# Patient Record
Sex: Female | Born: 1995 | Race: White | Hispanic: No | Marital: Single | Smoking: Current every day smoker
Health system: Southern US, Community
[De-identification: ages and names within clinical notes are randomized; demographics above are authoritative.]

---

## 2014-11-09 ENCOUNTER — Encounter (HOSPITAL_COMMUNITY): Payer: Self-pay | Admitting: Emergency Medicine

## 2014-11-09 ENCOUNTER — Emergency Department (HOSPITAL_COMMUNITY)
Admission: EM | Admit: 2014-11-09 | Discharge: 2014-11-09 | Disposition: A | Discharge status: Home / Self Care | Payer: 59 | Source: Home / Self Care | Attending: Emergency Medicine | Admitting: Emergency Medicine

## 2014-11-09 DIAGNOSIS — H66003 Acute suppurative otitis media without spontaneous rupture of ear drum, bilateral: Secondary | ICD-10-CM

## 2014-11-09 DIAGNOSIS — J069 Acute upper respiratory infection, unspecified: Secondary | ICD-10-CM

## 2014-11-09 LAB — POCT RAPID STREP A: Streptococcus, Group A Screen (Direct): NEGATIVE

## 2014-11-09 MED ORDER — GUAIFENESIN-CODEINE 100-10 MG/5ML PO SYRP: 5.0000 mL | ORAL_SOLUTION | Freq: Three times a day (TID) | ORAL | Status: DC | PRN

## 2014-11-09 MED ORDER — AMOXICILLIN 500 MG PO CAPS: 1000.0000 mg | ORAL_CAPSULE | Freq: Three times a day (TID) | ORAL | Status: DC

## 2014-11-09 MED ORDER — IPRATROPIUM BROMIDE 0.06 % NA SOLN: 2.0000 | Freq: Four times a day (QID) | NASAL | Status: DC

## 2014-11-09 NOTE — ED Notes (Signed)
C/o  Sore throat.  Fever.  Chills.  And body aches.  Denies n/v/d.  Symptoms present sine Sunday.

## 2014-11-09 NOTE — ED Provider Notes (Signed)
Chief Complaint   Sore Throat   History of Present Illness   Stacy Marsh is a 18 year old female who has had a three-day history of sore throat, nasal congestion with yellow drainage, cough productive of white to green sputum, headache, chills, myalgias, and temperature to 100. She denies any GI symptoms. No sick exposures.  Review of Systems   Other than as noted above, the patient denies any of the following symptoms: Systemic:  No fevers, chills, sweats, or myalgias. Eye:  No redness or discharge. ENT:  No ear pain, headache, nasal congestion, drainage, sinus pressure, or sore throat. Neck:  No neck pain, stiffness, or swollen glands. Lungs:  No cough, sputum production, hemoptysis, wheezing, chest tightness, shortness of breath or chest pain. GI:  No abdominal pain, nausea, vomiting or diarrhea.  PMFSH   Past medical history, family history, social history, meds, and allergies were reviewed.   Physical exam   Vital signs:  BP 145/82 mmHg  Pulse 110  Temp(Src) 100 F (37.8 C) (Oral)  Resp 16  SpO2 96%  LMP 11/02/2014 General:  Alert and oriented.  In no distress.  Skin warm and dry. Eye:  No conjunctival injection or drainage. Lids were normal. ENT:  Both TMs were slightly erythematous.  Nasal mucosa was clear and uncongested, without drainage.  Mucous membranes were moist.  Pharynx was clear with no exudate or drainage.  There were no oral ulcerations or lesions. Neck:  Supple, no adenopathy, tenderness or mass. Lungs:  No respiratory distress.  Lungs were clear to auscultation, without wheezes, rales or rhonchi.  Breath sounds were clear and equal bilaterally.  Heart:  Regular rhythm, without gallops, murmers or rubs. Skin:  Clear, warm, and dry, without rash or lesions.  Labs   Results for orders placed or performed during the hospital encounter of 11/09/14  POCT rapid strep A [facility])  Result Value Ref Range   Streptococcus, Group A Screen (Direct)  NEGATIVE NEGATIVE    Assessment     The primary encounter diagnosis was Viral URI. A diagnosis of Acute suppurative otitis media of both ears without spontaneous rupture of tympanic membranes, recurrence not specified was also pertinent to this visit.  There is no evidence of pneumonia, strep throat, or sinusitis.    Plan    1.  Meds:  The following meds were prescribed:   Discharge Medication List as of 11/09/2014  6:29 PM    START taking these medications   Details  amoxicillin (AMOXIL) 500 MG capsule Take 2 capsules (1,000 mg total) by mouth 3 (three) times daily., Starting 11/09/2014, Until Discontinued, Normal    guaiFENesin-codeine (ROBITUSSIN AC) 100-10 MG/5ML syrup Take 5 mLs by mouth 3 (three) times daily as needed for cough., Starting 11/09/2014, Until Discontinued, Print    ipratropium (ATROVENT) 0.06 % nasal spray Place 2 sprays into both nostrils 4 (four) times daily., Starting 11/09/2014, Until Discontinued, Normal        2.  Patient Education/Counseling:  The patient was given appropriate handouts, self care instructions, and instructed in symptomatic relief.  Instructed to get extra fluids and extra rest.    3.  Follow up:  The patient was told to follow up here if no better in 3 to 4 days, or sooner if becoming worse in any way, and given some red flag symptoms such as increasing fever, difficulty breathing, chest pain, or persistent vomiting which would prompt immediate return.       Reuben Likesavid C Octavio Matheney, MD 11/09/14 [phone]

## 2014-11-09 NOTE — Discharge Instructions (Signed)

## 2014-11-11 LAB — CULTURE, GROUP A STREP

## 2017-07-12 ENCOUNTER — Encounter (HOSPITAL_BASED_OUTPATIENT_CLINIC_OR_DEPARTMENT_OTHER): Payer: Self-pay

## 2017-07-12 ENCOUNTER — Emergency Department (HOSPITAL_BASED_OUTPATIENT_CLINIC_OR_DEPARTMENT_OTHER)
Admission: EM | Admit: 2017-07-12 | Discharge: 2017-07-12 | Disposition: A | Discharge status: Home / Self Care | Payer: Commercial Managed Care - PPO | Attending: Emergency Medicine | Admitting: Emergency Medicine

## 2017-07-12 ENCOUNTER — Emergency Department (HOSPITAL_BASED_OUTPATIENT_CLINIC_OR_DEPARTMENT_OTHER): Payer: Commercial Managed Care - PPO

## 2017-07-12 DIAGNOSIS — S99911A Unspecified injury of right ankle, initial encounter: Secondary | ICD-10-CM | POA: Diagnosis present

## 2017-07-12 DIAGNOSIS — F17210 Nicotine dependence, cigarettes, uncomplicated: Secondary | ICD-10-CM | POA: Insufficient documentation

## 2017-07-12 DIAGNOSIS — Y929 Unspecified place or not applicable: Secondary | ICD-10-CM | POA: Insufficient documentation

## 2017-07-12 DIAGNOSIS — S93401A Sprain of unspecified ligament of right ankle, initial encounter: Secondary | ICD-10-CM | POA: Insufficient documentation

## 2017-07-12 DIAGNOSIS — Y999 Unspecified external cause status: Secondary | ICD-10-CM | POA: Diagnosis not present

## 2017-07-12 DIAGNOSIS — Y9351 Activity, roller skating (inline) and skateboarding: Secondary | ICD-10-CM | POA: Insufficient documentation

## 2017-07-12 MED ORDER — HYDROCODONE-ACETAMINOPHEN 5-325 MG PO TABS
1.0000 | ORAL_TABLET | Freq: Once | ORAL | Status: AC
  Administered 2017-07-12: 1 via ORAL
  Filled 2017-07-12: qty 1

## 2017-07-12 MED ORDER — IBUPROFEN 800 MG PO TABS: 800.0000 mg | ORAL_TABLET | Freq: Three times a day (TID) | ORAL | 0 refills | Status: AC

## 2017-07-12 NOTE — Discharge Instructions (Signed)
Do not bear weight without crutches until you can walk without pain. Ace wrap for compression.  Intermittently apply ice.  Follow-up with Dr. Pearletha ForgeHudnall if not improving in one week

## 2017-07-12 NOTE — ED Triage Notes (Addendum)
C/o injured right ankle and posterior heel jumping off skateboard last night-NAD-limping gait

## 2017-07-12 NOTE — ED Notes (Signed)
ED Provider at bedside. 

## 2017-07-12 NOTE — ED Provider Notes (Signed)
[facility]   CSN: [id number] Arrival date & time: 07/12/17  1328     History   Chief Complaint Chief Complaint  Patient presents with  . Ankle Injury    HPI Stacy Marsh is a 21 y.o. female. Cc: Ankle sprain  HPI [age] female. Came off of a skateboard last night and inverted her ankle and has pain in the right ankle and walks with a limp  History reviewed. No pertinent past medical history.  There are no active problems to display for this patient.   History reviewed. No pertinent surgical history.  OB History    No data available       Home Medications    Prior to Admission medications   Medication Sig Start Date End Date Taking? Authorizing Provider  ibuprofen (ADVIL,MOTRIN) 800 MG tablet Take 1 tablet (800 mg total) by mouth 3 (three) times daily. 07/12/17   Rolland Porter, MD    Family History No family history on file.  Social History Social History  Substance Use Topics  . Smoking status: Current Every Day Smoker    Packs/day: 1.00    Types: Cigarettes  . Smokeless tobacco: Never Used  . Alcohol use No     Comment: weekly     Allergies   Patient has no known allergies.   Review of Systems Review of Systems  Musculoskeletal:       Pain around the right ankle. Pain with attempted ambulation.     Physical Exam Updated Vital Signs BP (!) 153/86 (BP Location: Left Arm)   Pulse (!) 118   Temp 98.2 F (36.8 C) (Oral)   Resp 16   Ht 5\' 4"  (1.626 m)   Wt 67.1 kg (148 lb)   LMP 06/28/2017   SpO2 100%   BMI 25.40 kg/m   Physical Exam  Constitutional: She is oriented to person, place, and time. She appears well-developed and well-nourished. No distress.  HENT:  Head: Normocephalic.  Eyes: Pupils are equal, round, and reactive to light. Conjunctivae are normal. No scleral icterus.  Neck: Normal range of motion. Neck supple. No thyromegaly present.  Cardiovascular: Normal rate and regular rhythm.  Exam reveals no  gallop and no friction rub.   No murmur heard. Pulmonary/Chest: Effort normal and breath sounds normal. No respiratory distress. She has no wheezes. She has no rales.  Abdominal: Soft. Bowel sounds are normal. She exhibits no distension. There is no tenderness. There is no rebound.  Musculoskeletal: Normal range of motion.  Tenderness primarily medial and posterior around the ankle joint. No pain or tenderness laterally over the lateral malleolus. Nontender over the base or head of the fifth metatarsal.  Neurological: She is alert and oriented to person, place, and time.  Skin: Skin is warm and dry. No rash noted.  Psychiatric: She has a normal mood and affect. Her behavior is normal.     ED Treatments / Results  Labs (all labs ordered are listed, but only abnormal results are displayed) Labs Reviewed - No data to display  EKG  EKG Interpretation None       Radiology Dg Ankle Complete Right  Result Date: 07/12/2017 CLINICAL DATA:  Pain following fall from skateboard EXAM: RIGHT ANKLE - COMPLETE 3+ VIEW COMPARISON:  None. FINDINGS: Frontal, oblique, and lateral views were obtained. There is mild soft tissue swelling. There is no evident fracture or joint effusion. Joint spaces appear normal. Ankle mortise appears intact. No erosive change. IMPRESSION: Mild soft tissue  swelling. No evident fracture or arthropathy. Ankle mortise appears intact. Electronically Signed   By: Bretta BangWilliam  Woodruff III M.D.   On: 07/12/2017 13:53    Procedures Procedures (including critical care time)  Medications Ordered in ED Medications  HYDROcodone-acetaminophen (NORCO/VICODIN) 5-325 MG per tablet 1 tablet (1 tablet Oral Given 07/12/17 1353)     Initial Impression / Assessment and Plan / ED Course  I have reviewed the triage vital signs and the nursing notes.  Pertinent labs & imaging results that were available during my care of the patient were reviewed by me and considered in my medical decision  making (see chart for details).     X-ray negative. Ace wrap applies. Will ambulate with crutches systems until pain-free.  Final Clinical Impressions(s) / ED Diagnoses   Final diagnoses:  Sprain of right ankle, unspecified ligament, initial encounter    New Prescriptions New Prescriptions   IBUPROFEN (ADVIL,MOTRIN) 800 MG TABLET    Take 1 tablet (800 mg total) by mouth 3 (three) times daily.     Rolland PorterJames, Kimberlly Norgard, MD 07/12/17 1407

## 2019-03-19 IMAGING — DX DG ANKLE COMPLETE 3+V*R*
3 series · 3 of 3 positions shown · non-contrast
Comparison: None.

CLINICAL DATA: Pain following fall from skateboard

EXAM:
RIGHT ANKLE - COMPLETE 3+ VIEW

[ankle ap]
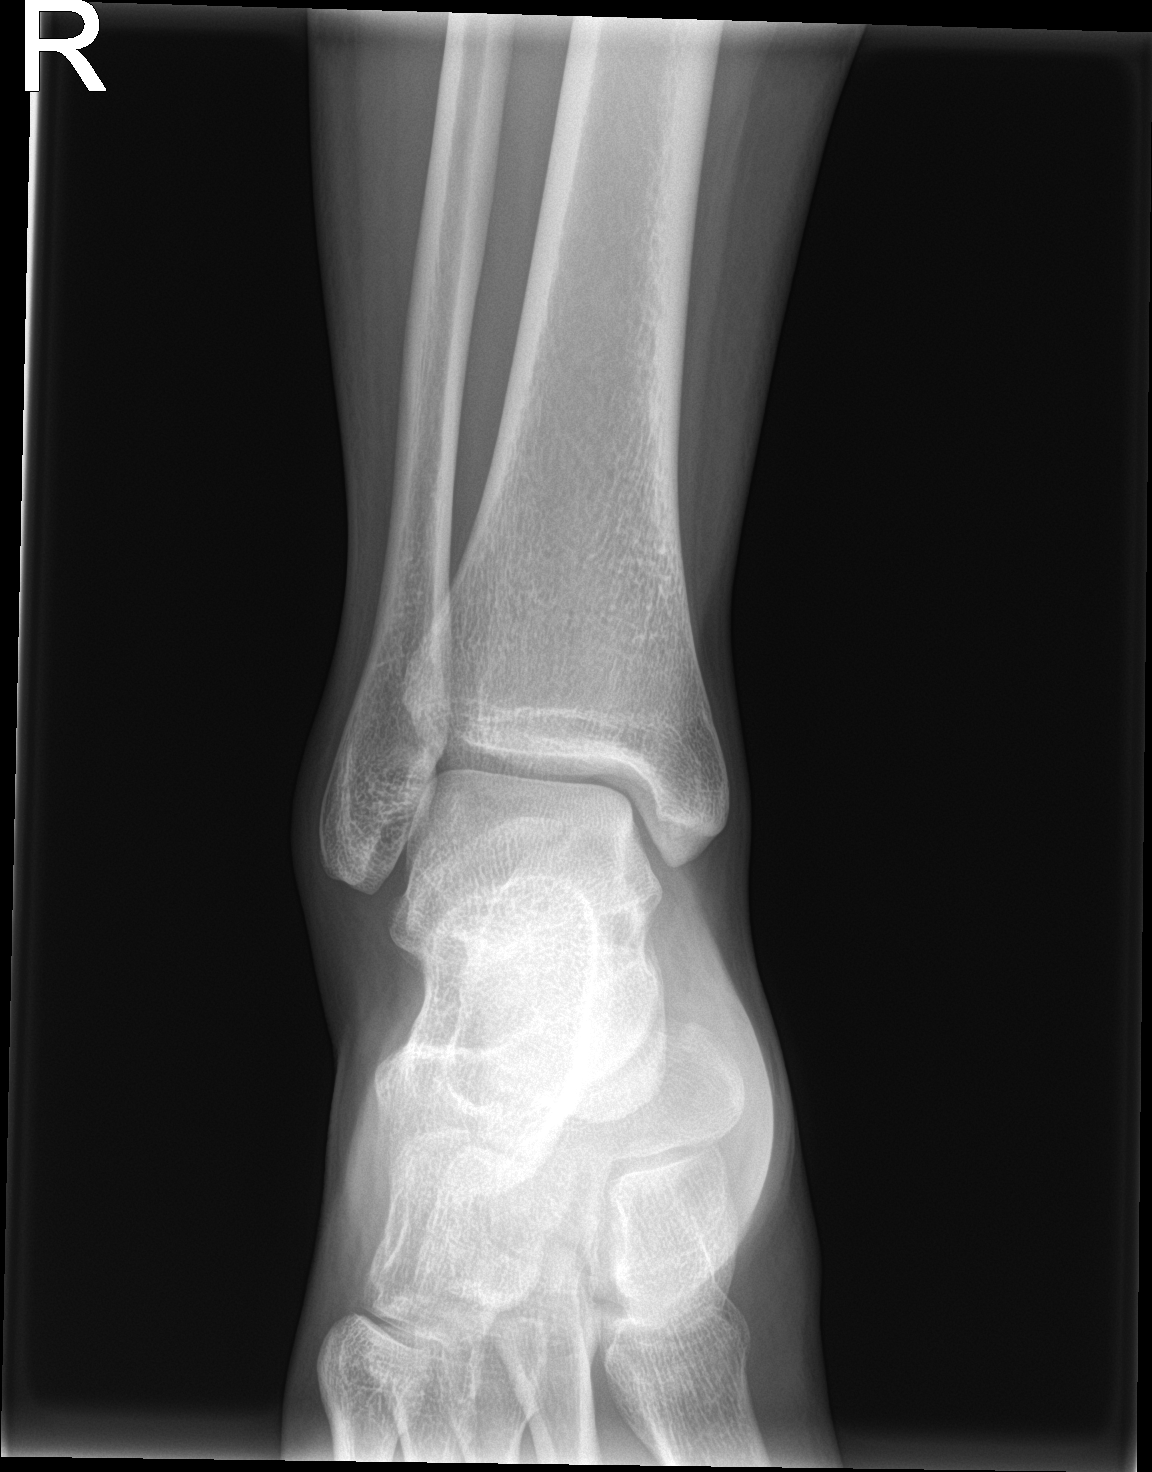

[ankle obl]
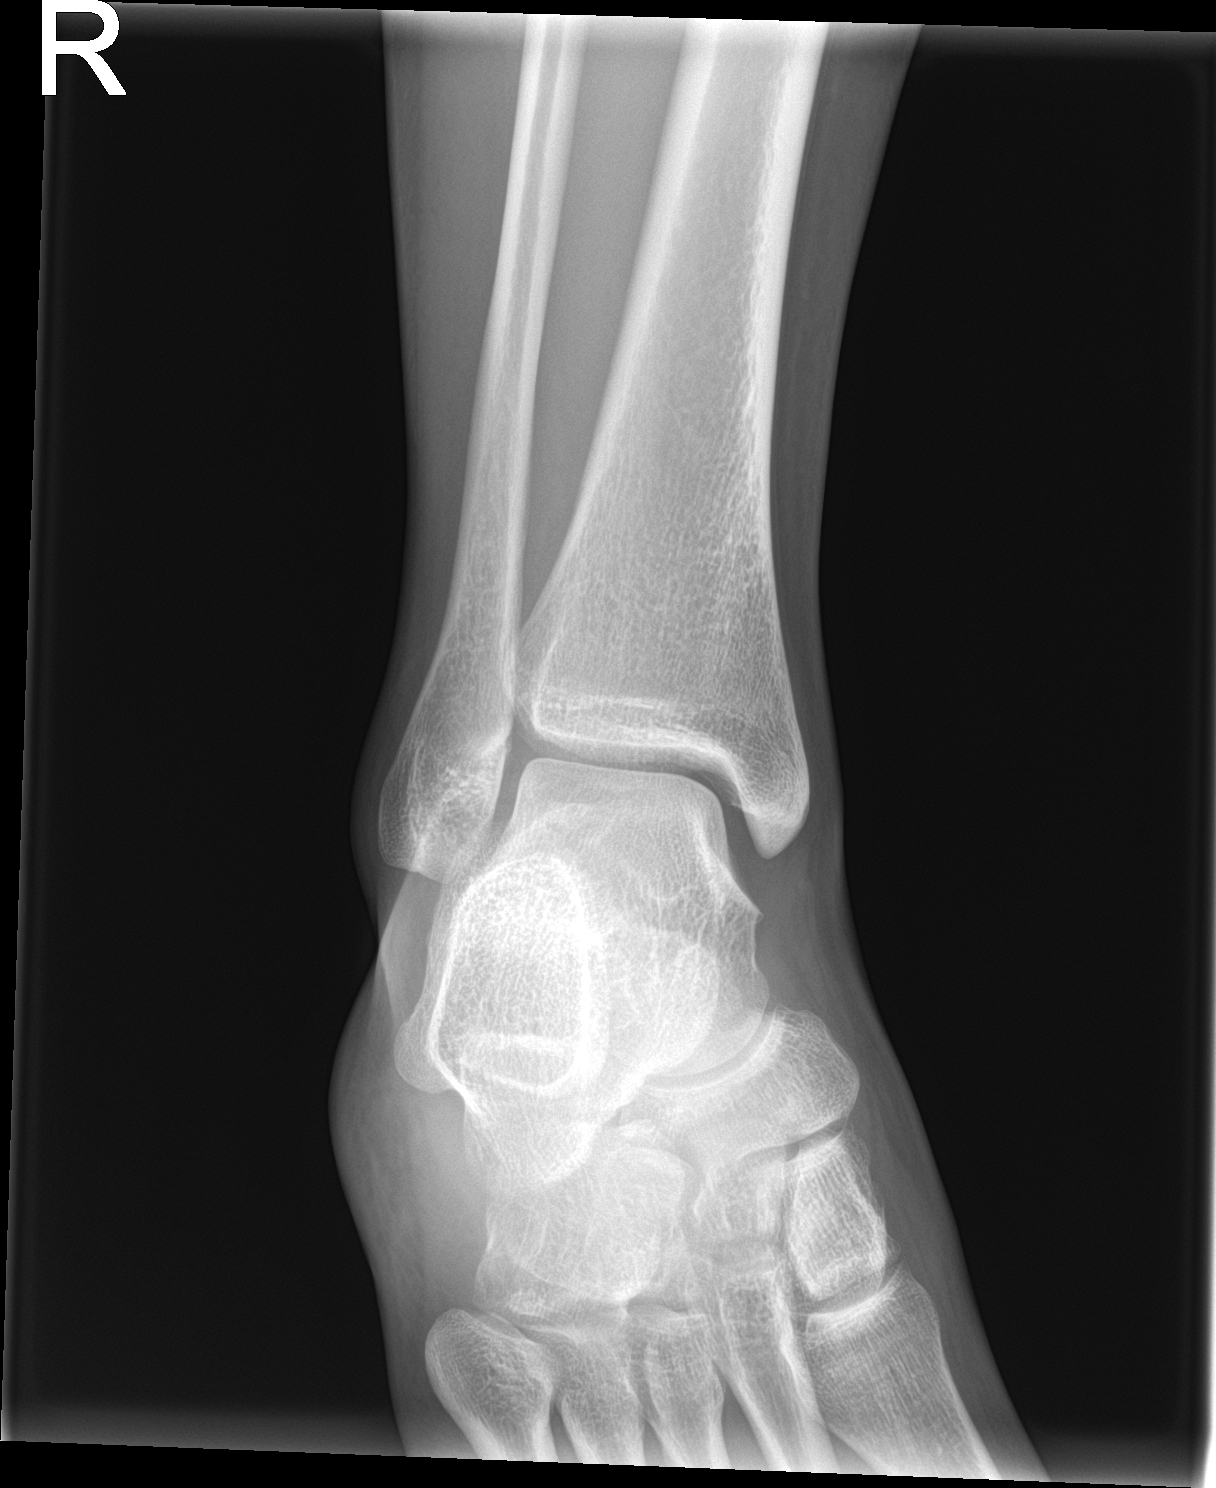

[ankle lat]
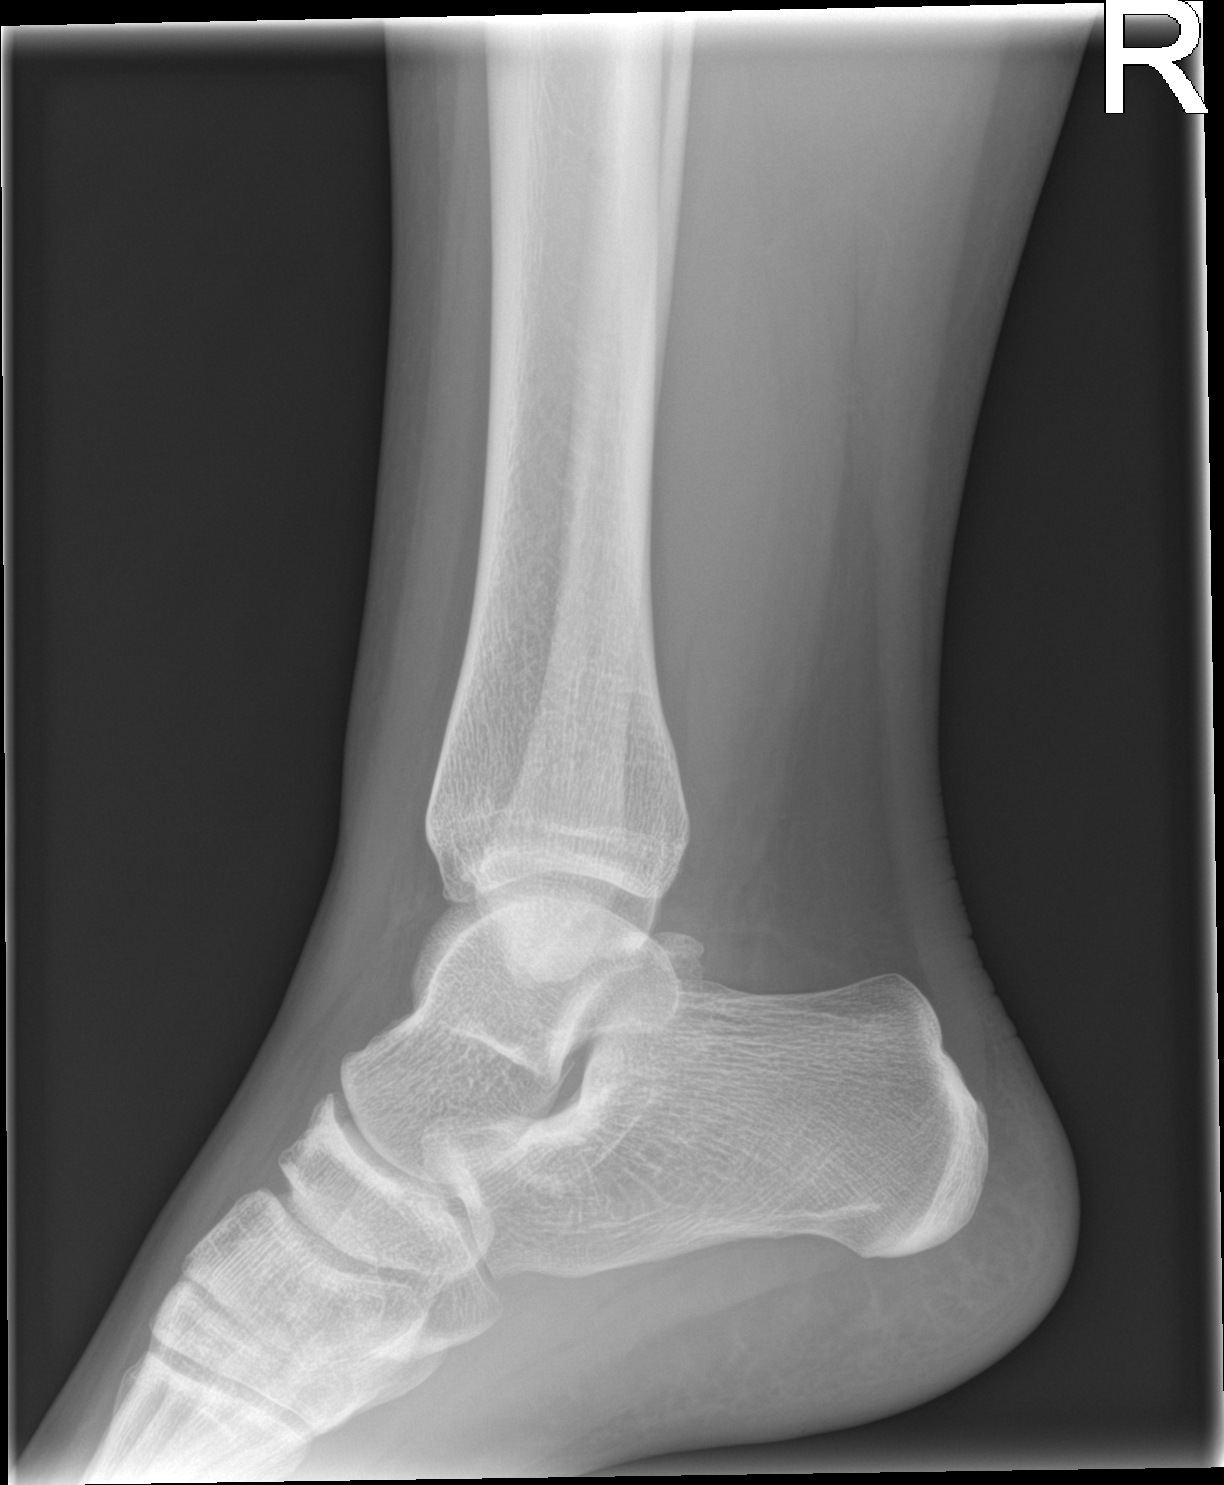

[3 of 3 positions shown; findings below may reference images not displayed]

FINDINGS: Frontal, oblique, and lateral views were obtained. There is mild
soft tissue swelling. There is no evident fracture or joint
effusion. Joint spaces appear normal. Ankle mortise appears intact.
No erosive change.
IMPRESSION: Mild soft tissue swelling. No evident fracture or arthropathy. Ankle
mortise appears intact.

## 2023-06-23 ENCOUNTER — Encounter (HOSPITAL_BASED_OUTPATIENT_CLINIC_OR_DEPARTMENT_OTHER): Payer: Self-pay

## 2023-06-23 ENCOUNTER — Other Ambulatory Visit: Payer: Self-pay

## 2023-06-23 ENCOUNTER — Emergency Department (HOSPITAL_BASED_OUTPATIENT_CLINIC_OR_DEPARTMENT_OTHER): Payer: Self-pay | Admitting: Radiology

## 2023-06-23 ENCOUNTER — Emergency Department (HOSPITAL_BASED_OUTPATIENT_CLINIC_OR_DEPARTMENT_OTHER)
Admission: EM | Admit: 2023-06-23 | Discharge: 2023-06-23 | Disposition: A | Discharge status: Home / Self Care | Payer: Self-pay | Attending: Emergency Medicine | Admitting: Emergency Medicine

## 2023-06-23 DIAGNOSIS — K2991 Gastroduodenitis, unspecified, with bleeding: Secondary | ICD-10-CM | POA: Insufficient documentation

## 2023-06-23 DIAGNOSIS — K920 Hematemesis: Secondary | ICD-10-CM | POA: Insufficient documentation

## 2023-06-23 DIAGNOSIS — K2921 Alcoholic gastritis with bleeding: Secondary | ICD-10-CM

## 2023-06-23 LAB — URINALYSIS, ROUTINE W REFLEX MICROSCOPIC
Bacteria, UA: NONE SEEN
Bilirubin Urine: NEGATIVE
Glucose, UA: NEGATIVE mg/dL
Hgb urine dipstick: NEGATIVE
Leukocytes,Ua: NEGATIVE
Nitrite: NEGATIVE
Protein, ur: 30 mg/dL — AB
Specific Gravity, Urine: 1.023 (ref 1.005–1.030)
pH: 8 (ref 5.0–8.0)

## 2023-06-23 LAB — CBC
HCT: 45.5 % (ref 36.0–46.0)
Hemoglobin: 16.3 g/dL — ABNORMAL HIGH (ref 12.0–15.0)
MCH: 35.1 pg — ABNORMAL HIGH (ref 26.0–34.0)
MCHC: 35.8 g/dL (ref 30.0–36.0)
MCV: 98.1 fL (ref 80.0–100.0)
Platelets: 107 10*3/uL — ABNORMAL LOW (ref 150–400)
RBC: 4.64 MIL/uL (ref 3.87–5.11)
RDW: 11.6 % (ref 11.5–15.5)
WBC: 5.1 10*3/uL (ref 4.0–10.5)
nRBC: 0 % (ref 0.0–0.2)

## 2023-06-23 LAB — COMPREHENSIVE METABOLIC PANEL
ALT: 79 U/L — ABNORMAL HIGH (ref 0–44)
AST: 211 U/L — ABNORMAL HIGH (ref 15–41)
Albumin: 5.1 g/dL — ABNORMAL HIGH (ref 3.5–5.0)
Alkaline Phosphatase: 141 U/L — ABNORMAL HIGH (ref 38–126)
Anion gap: 15 (ref 5–15)
BUN: <5 mg/dL — ABNORMAL LOW (ref 6–20)
CO2: 25 mmol/L (ref 22–32)
Calcium: 9.7 mg/dL (ref 8.9–10.3)
Chloride: 98 mmol/L (ref 98–111)
Creatinine, Ser: 0.49 mg/dL (ref 0.44–1.00)
GFR, Estimated: >60 mL/min (ref >60)
Glucose, Bld: 96 mg/dL (ref 70–99)
Potassium: 3.7 mmol/L (ref 3.5–5.1)
Sodium: 138 mmol/L (ref 135–145)
Total Bilirubin: 1.5 mg/dL — ABNORMAL HIGH (ref 0.3–1.2)
Total Protein: 7.6 g/dL (ref 6.5–8.1)

## 2023-06-23 LAB — LIPASE, BLOOD: Lipase: 16 U/L (ref 11–51)

## 2023-06-23 LAB — PREGNANCY, URINE: Preg Test, Ur: NEGATIVE

## 2023-06-23 MED ORDER — SODIUM CHLORIDE 0.9 % IV BOLUS
1000.0000 mL | Freq: Once | INTRAVENOUS | Status: AC
  Administered 2023-06-23: 1000 mL via INTRAVENOUS

## 2023-06-23 MED ORDER — ONDANSETRON HCL 4 MG/2ML IJ SOLN
4.0000 mg | Freq: Once | INTRAMUSCULAR | Status: AC
  Administered 2023-06-23: 4 mg via INTRAVENOUS
  Filled 2023-06-23: qty 2

## 2023-06-23 MED ORDER — PANTOPRAZOLE SODIUM 40 MG IV SOLR
40.0000 mg | Freq: Once | INTRAVENOUS | Status: AC
  Administered 2023-06-23: 40 mg via INTRAVENOUS
  Filled 2023-06-23: qty 10

## 2023-06-23 MED ORDER — OMEPRAZOLE 40 MG PO CPDR: 40.0000 mg | DELAYED_RELEASE_CAPSULE | Freq: Two times a day (BID) | ORAL | 1 refills | Status: AC

## 2023-06-23 NOTE — ED Triage Notes (Addendum)
Pt reports mid upper abd pain that started 2 days ago associated with lack of appetite and hematemesis.   Pt also reports she is a heavy drinker. States she drinks "12+" beers a day. Last drank last night.

## 2023-06-23 NOTE — ED Provider Notes (Signed)
[place] EMERGENCY DEPARTMENT AT [facility] Provider Note   CSN: [id number] Arrival date & time: 06/23/23  1617     History  Chief Complaint  Patient presents with   Abdominal Pain    Stacy Marsh is a 27 y.o. female, history of alcohol use disorder, who presents to the ED secondary to epigastric pain has been going on for the last 2 weeks, that is intermittent in nature.  She states the epigastric pain is worse when she drinks alcohol, eat spicy foods, or eats.  She states that she does drink about 12+ beers a day, and has been doing this for the last 5 years.  Associated nausea .She states she vomited blood a couple weeks ago, and then once yesterday.  Denies history of dry heaving.  Is having normal bowel movements.  Denies any current abdominal pain.  Denies any chest pain or shortness of breath.  Denies any kind of heavy NSAID use. Home Medications Prior to Admission medications   Medication Sig Start Date End Date Taking? Authorizing Provider  omeprazole (PRILOSEC) 40 MG capsule Take 1 capsule (40 mg total) by mouth 2 (two) times daily before a meal. 06/23/23  Yes Mickel Schreur L, PA  ibuprofen (ADVIL,MOTRIN) 800 MG tablet Take 1 tablet (800 mg total) by mouth 3 (three) times daily. 07/12/17   Rolland Porter, MD      Allergies    Patient has no known allergies.    Review of Systems   Review of Systems  Gastrointestinal:  Positive for abdominal pain, nausea and vomiting. Negative for constipation.    Physical Exam Updated Vital Signs BP (!) 173/113 (BP Location: Left Arm)   Pulse (!) 112   Temp 98.4 F (36.9 C) (Oral)   Resp 19   Ht 5\' 4"  (1.626 m)   Wt 70.3 kg   LMP 06/09/2023 (Approximate)   SpO2 100%   BMI 26.61 kg/m  Physical Exam Vitals and nursing note reviewed.  Constitutional:      General: She is not in acute distress.    Appearance: She is well-developed.  HENT:     Head: Normocephalic and atraumatic.  Eyes:     Conjunctiva/sclera:  Conjunctivae normal.  Cardiovascular:     Rate and Rhythm: Normal rate and regular rhythm.     Heart sounds: No murmur heard. Pulmonary:     Effort: Pulmonary effort is normal. No respiratory distress.     Breath sounds: Normal breath sounds.  Abdominal:     Palpations: Abdomen is soft.     Tenderness: There is no abdominal tenderness.  Musculoskeletal:        General: No swelling.     Cervical back: Neck supple.  Skin:    General: Skin is warm and dry.     Capillary Refill: Capillary refill takes less than 2 seconds.  Neurological:     Mental Status: She is alert.  Psychiatric:        Mood and Affect: Mood normal.     ED Results / Procedures / Treatments   Labs (all labs ordered are listed, but only abnormal results are displayed) Labs Reviewed  COMPREHENSIVE METABOLIC PANEL - Abnormal; Notable for the following components:      Result Value   BUN <5 (*)    Albumin 5.1 (*)    AST 211 (*)    ALT 79 (*)    Alkaline Phosphatase 141 (*)    Total Bilirubin 1.5 (*)    All other components within  normal limits  CBC - Abnormal; Notable for the following components:   Hemoglobin 16.3 (*)    MCH 35.1 (*)    Platelets 107 (*)    All other components within normal limits  URINALYSIS, ROUTINE W REFLEX MICROSCOPIC - Abnormal; Notable for the following components:   Ketones, ur TRACE (*)    Protein, ur 30 (*)    All other components within normal limits  LIPASE, BLOOD  PREGNANCY, URINE    EKG EKG Interpretation Date/Time:  Sunday June 23 2023 16:36:56 EDT Ventricular Rate:  101 PR Interval:  145 QRS Duration:  85 QT Interval:  345 QTC Calculation: 448 R Axis:   68  Text Interpretation: Sinus tachycardia no prior ECG for comparison. No STEMI Confirmed by Theda Belfast ([id number]) on 06/23/2023 5:00:03 PM  Radiology DG Chest 2 View  Result Date: 06/23/2023 CLINICAL DATA:  Hematemesis EXAM: CHEST - 2 VIEW COMPARISON:  None Available. FINDINGS: The heart size and  mediastinal contours are within normal limits. No focal pulmonary opacity. No pleural effusion or pneumothorax. The visualized upper abdomen is unremarkable. No acute osseous abnormality. IMPRESSION: No active cardiopulmonary disease. Electronically Signed   By: Jacob Moores M.D.   On: 06/23/2023 17:23    Procedures Procedures    Medications Ordered in ED Medications  sodium chloride 0.9 % bolus 1,000 mL (1,000 mLs Intravenous New Bag/Given 06/23/23 1752)  pantoprazole (PROTONIX) injection 40 mg (40 mg Intravenous Given 06/23/23 1750)  ondansetron (ZOFRAN) injection 4 mg (4 mg Intravenous Given 06/23/23 1750)    ED Course/ Medical Decision Making/ A&P                             Medical Decision Making Patient is a 27 year old female, here for hematemesis x 2 times, as well as epigastric pain, has been going on for the last 2 weeks.  She states she drinks about 12 beers a day, and has been doing this for the last 5 years.  She is on omeprazole 40 mg, for some gastritis.  Still states that after she eats she has a burning sensation in her epigastric area.  She is well-appearing, does not appear in any distress denies any chest pain or shortness of breath.  No pain when taking a deep breath.  Will obtain a chest x-ray just to evaluate for any kind of perforation of the esophagus, as well as blood work to check her hemoglobin.  Amount and/or Complexity of Data Reviewed Labs: ordered.    Details: Hemoglobin 16+, elevation of LFTs Radiology: ordered.    Details: X-ray shows no acute free air Discussion of management or test interpretation with external provider(s): Discussed with patient, her x-rays are reassuring, she is feeling better after medications, hemoglobin is stable, and I believe that this may be secondary to her alcoholism.  We discussed alcoholic gastritis, I believe this is what is going on.  She denies any abdominal pain on my exam, and is feeling better will follow-up with  primary care doctor.  Started on 40 mg omeprazole twice a day.  Encouraged to follow-up with her primary care.  Return precautions emphasized.  Provided information for detox if she chooses to quit alcohol.  Risk Prescription drug management.   Final Clinical Impression(s) / ED Diagnoses Final diagnoses:  Hematemesis with nausea  Acute alcoholic gastritis with hemorrhage    Rx / DC Orders ED Discharge Orders  Ordered    omeprazole (PRILOSEC) 40 MG capsule  2 times daily before meals        06/23/23 1840              Alamin Mccuiston, Harley [place], [place] 06/23/23 1852    Tegeler, Canary Brim, MD 06/23/23 2001

## 2023-06-23 NOTE — Discharge Instructions (Addendum)
Please follow-up with your primary care doctor, if you do not have a primary care doctor, see the number below, to call to help find a primary care doctor.  Additionally you should start taking your pantoprazole twice a day, I have sent you some medication and a refill for this.  Make sure you cut down your alcohol use.  Return to the ER if you get lightheaded, dizzy, or persistently vomiting blood or if severe abdominal pain.
